# Patient Record
Sex: Female | Born: 1960 | Race: Black or African American | Hispanic: No | Marital: Single | Smoking: Former smoker
Health system: Southern US, Community
[De-identification: ages and names within clinical notes are randomized; demographics above are authoritative.]

## PROBLEM LIST (undated history)

## (undated) DIAGNOSIS — R5383 Other fatigue: Secondary | ICD-10-CM

## (undated) DIAGNOSIS — I10 Essential (primary) hypertension: Secondary | ICD-10-CM

## (undated) DIAGNOSIS — R7303 Prediabetes: Secondary | ICD-10-CM

## (undated) DIAGNOSIS — E119 Type 2 diabetes mellitus without complications: Secondary | ICD-10-CM

## (undated) DIAGNOSIS — D649 Anemia, unspecified: Secondary | ICD-10-CM

## (undated) DIAGNOSIS — J45909 Unspecified asthma, uncomplicated: Secondary | ICD-10-CM

## (undated) DIAGNOSIS — D219 Benign neoplasm of connective and other soft tissue, unspecified: Secondary | ICD-10-CM

## (undated) DIAGNOSIS — J309 Allergic rhinitis, unspecified: Secondary | ICD-10-CM

## (undated) DIAGNOSIS — N915 Oligomenorrhea, unspecified: Secondary | ICD-10-CM

## (undated) DIAGNOSIS — M7710 Lateral epicondylitis, unspecified elbow: Secondary | ICD-10-CM

## (undated) HISTORY — DX: Benign neoplasm of connective and other soft tissue, unspecified: D21.9

## (undated) HISTORY — DX: Oligomenorrhea, unspecified: N91.5

## (undated) HISTORY — DX: Anemia, unspecified: D64.9

## (undated) HISTORY — DX: Type 2 diabetes mellitus without complications: E11.9

## (undated) HISTORY — PX: OVARY SURGERY: SHX727

## (undated) HISTORY — DX: Unspecified asthma, uncomplicated: J45.909

## (undated) HISTORY — DX: Other fatigue: R53.83

## (undated) HISTORY — DX: Prediabetes: R73.03

## (undated) HISTORY — DX: Lateral epicondylitis, unspecified elbow: M77.10

## (undated) HISTORY — PX: FOOT SURGERY: SHX648

## (undated) HISTORY — DX: Allergic rhinitis, unspecified: J30.9

---

## 1998-02-20 ENCOUNTER — Other Ambulatory Visit: Admission: RE | Admit: 1998-02-20 | Discharge: 1998-02-20 | Payer: Self-pay | Admitting: Obstetrics and Gynecology

## 1999-01-29 ENCOUNTER — Other Ambulatory Visit: Admission: RE | Admit: 1999-01-29 | Discharge: 1999-01-29 | Payer: Self-pay | Admitting: Obstetrics and Gynecology

## 2000-11-24 ENCOUNTER — Other Ambulatory Visit: Admission: RE | Admit: 2000-11-24 | Discharge: 2000-11-24 | Payer: Self-pay | Admitting: Obstetrics and Gynecology

## 2001-12-15 ENCOUNTER — Emergency Department (HOSPITAL_COMMUNITY): Admission: EM | Admit: 2001-12-15 | Discharge: 2001-12-15 | Payer: Self-pay | Admitting: Emergency Medicine

## 2002-01-19 ENCOUNTER — Other Ambulatory Visit: Admission: RE | Admit: 2002-01-19 | Discharge: 2002-01-19 | Payer: Self-pay | Admitting: Obstetrics and Gynecology

## 2002-01-26 ENCOUNTER — Ambulatory Visit (HOSPITAL_COMMUNITY): Admission: RE | Admit: 2002-01-26 | Discharge: 2002-01-26 | Payer: Self-pay | Admitting: Obstetrics and Gynecology

## 2002-01-26 ENCOUNTER — Encounter: Payer: Self-pay | Admitting: Obstetrics and Gynecology

## 2002-02-01 ENCOUNTER — Encounter: Payer: Self-pay | Admitting: *Deleted

## 2002-02-01 ENCOUNTER — Encounter: Admission: RE | Admit: 2002-02-01 | Discharge: 2002-02-01 | Payer: Self-pay | Admitting: *Deleted

## 2003-02-01 ENCOUNTER — Other Ambulatory Visit: Admission: RE | Admit: 2003-02-01 | Discharge: 2003-02-01 | Payer: Self-pay | Admitting: Obstetrics and Gynecology

## 2003-05-23 ENCOUNTER — Emergency Department (HOSPITAL_COMMUNITY): Admission: EM | Admit: 2003-05-23 | Discharge: 2003-05-23 | Payer: Self-pay | Admitting: Emergency Medicine

## 2003-06-14 ENCOUNTER — Encounter: Admission: RE | Admit: 2003-06-14 | Discharge: 2003-07-27 | Payer: Self-pay | Admitting: General Practice

## 2004-03-14 ENCOUNTER — Other Ambulatory Visit: Admission: RE | Admit: 2004-03-14 | Discharge: 2004-03-14 | Payer: Self-pay | Admitting: Obstetrics and Gynecology

## 2004-03-16 ENCOUNTER — Ambulatory Visit (HOSPITAL_COMMUNITY): Admission: RE | Admit: 2004-03-16 | Discharge: 2004-03-16 | Payer: Self-pay | Admitting: Obstetrics and Gynecology

## 2004-05-22 ENCOUNTER — Ambulatory Visit (HOSPITAL_COMMUNITY): Admission: RE | Admit: 2004-05-22 | Discharge: 2004-05-22 | Payer: Self-pay | Admitting: Obstetrics and Gynecology

## 2004-07-03 ENCOUNTER — Ambulatory Visit: Admission: RE | Admit: 2004-07-03 | Discharge: 2004-07-03 | Payer: Self-pay | Admitting: Internal Medicine

## 2004-07-30 ENCOUNTER — Encounter: Admission: RE | Admit: 2004-07-30 | Discharge: 2004-07-30 | Payer: Self-pay | Admitting: Internal Medicine

## 2004-07-31 ENCOUNTER — Encounter: Admission: RE | Admit: 2004-07-31 | Discharge: 2004-08-24 | Payer: Self-pay | Admitting: Internal Medicine

## 2004-09-11 ENCOUNTER — Ambulatory Visit (HOSPITAL_COMMUNITY): Admission: RE | Admit: 2004-09-11 | Discharge: 2004-09-11 | Payer: Self-pay | Admitting: Nephrology

## 2004-12-10 ENCOUNTER — Encounter (INDEPENDENT_AMBULATORY_CARE_PROVIDER_SITE_OTHER): Payer: Self-pay | Admitting: *Deleted

## 2004-12-10 ENCOUNTER — Ambulatory Visit (HOSPITAL_COMMUNITY): Admission: RE | Admit: 2004-12-10 | Discharge: 2004-12-10 | Payer: Self-pay | Admitting: Obstetrics and Gynecology

## 2005-03-15 ENCOUNTER — Other Ambulatory Visit: Admission: RE | Admit: 2005-03-15 | Discharge: 2005-03-15 | Payer: Self-pay | Admitting: Obstetrics and Gynecology

## 2005-03-18 ENCOUNTER — Ambulatory Visit (HOSPITAL_COMMUNITY): Admission: RE | Admit: 2005-03-18 | Discharge: 2005-03-18 | Payer: Self-pay | Admitting: Obstetrics and Gynecology

## 2005-10-08 ENCOUNTER — Ambulatory Visit (HOSPITAL_COMMUNITY): Admission: RE | Admit: 2005-10-08 | Discharge: 2005-10-08 | Payer: Self-pay | Admitting: Nephrology

## 2005-12-30 ENCOUNTER — Encounter (INDEPENDENT_AMBULATORY_CARE_PROVIDER_SITE_OTHER): Payer: Self-pay | Admitting: *Deleted

## 2005-12-30 ENCOUNTER — Inpatient Hospital Stay (HOSPITAL_COMMUNITY): Admission: RE | Admit: 2005-12-30 | Discharge: 2006-01-01 | Payer: Self-pay | Admitting: Obstetrics and Gynecology

## 2006-03-18 ENCOUNTER — Other Ambulatory Visit: Admission: RE | Admit: 2006-03-18 | Discharge: 2006-03-18 | Payer: Self-pay | Admitting: Obstetrics and Gynecology

## 2006-03-19 ENCOUNTER — Ambulatory Visit (HOSPITAL_COMMUNITY): Admission: RE | Admit: 2006-03-19 | Discharge: 2006-03-19 | Payer: Self-pay | Admitting: Obstetrics and Gynecology

## 2006-11-19 ENCOUNTER — Ambulatory Visit: Payer: Self-pay | Admitting: Cardiovascular Disease

## 2006-12-04 ENCOUNTER — Ambulatory Visit: Payer: Self-pay

## 2006-12-04 ENCOUNTER — Encounter: Payer: Self-pay | Admitting: Cardiovascular Disease

## 2006-12-16 IMAGING — CR DG LUMBAR SPINE COMPLETE 4+V
5 series · 5 of 5 positions shown · non-contrast
Comparison: None.

CLINICAL DATA: Low back pain. 
 LUMBAR SPINE COMPLETE:

[view not recorded (1 of 5)]
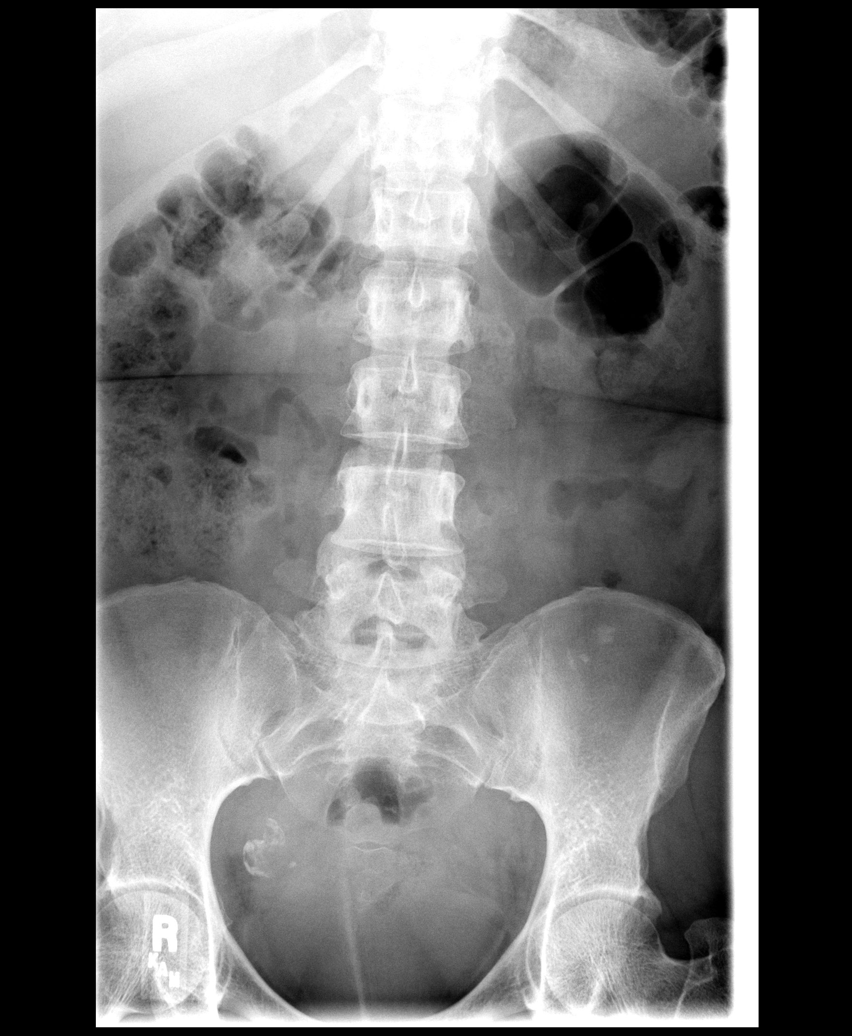

[view not recorded (2 of 5)]
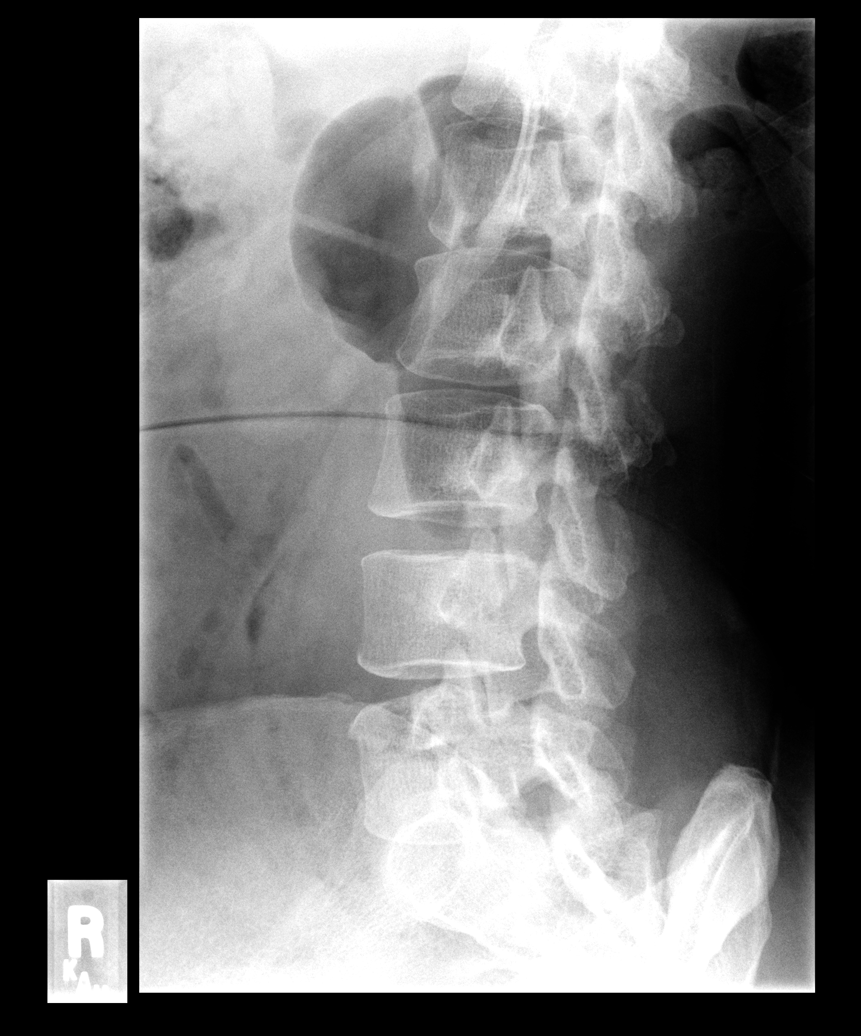

[view not recorded (3 of 5)]
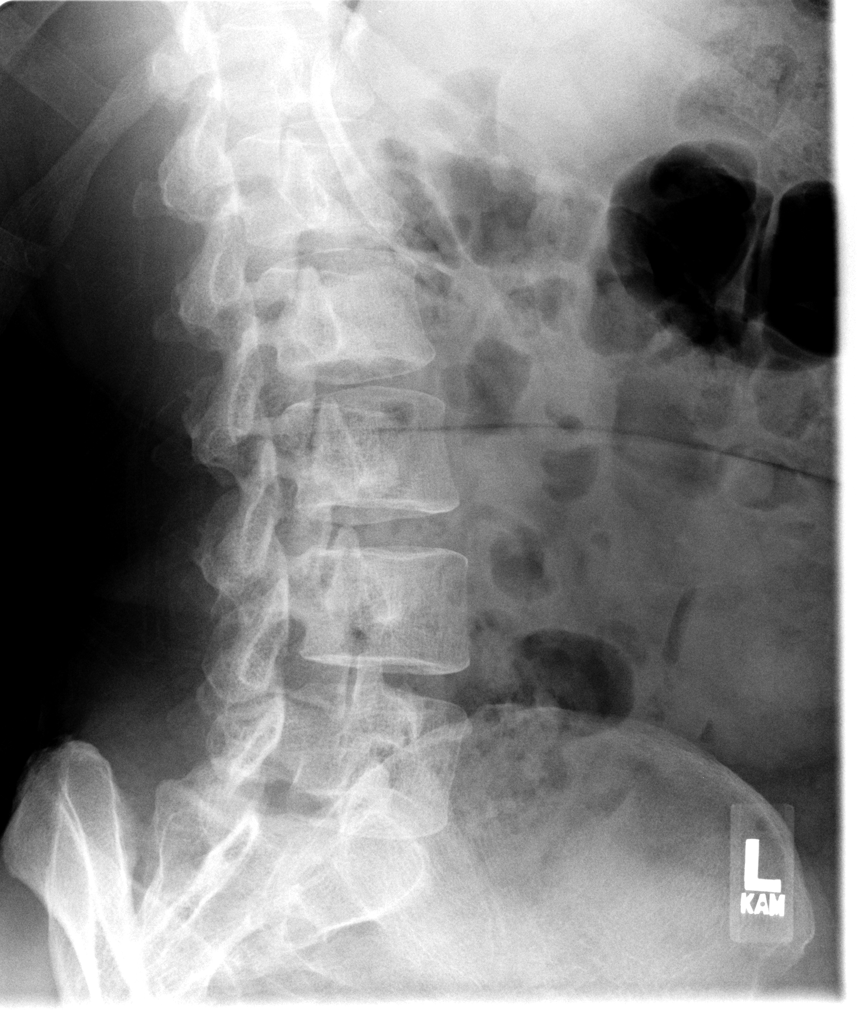

[view not recorded (4 of 5)]
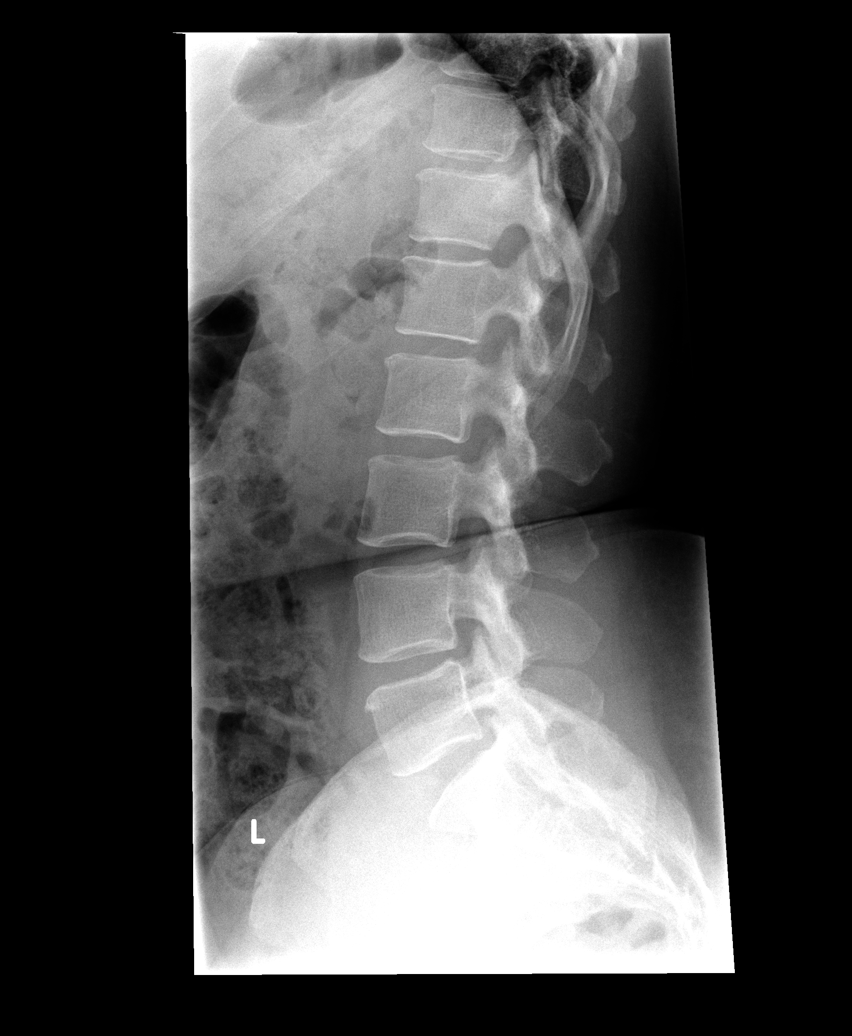

[view not recorded (5 of 5)]
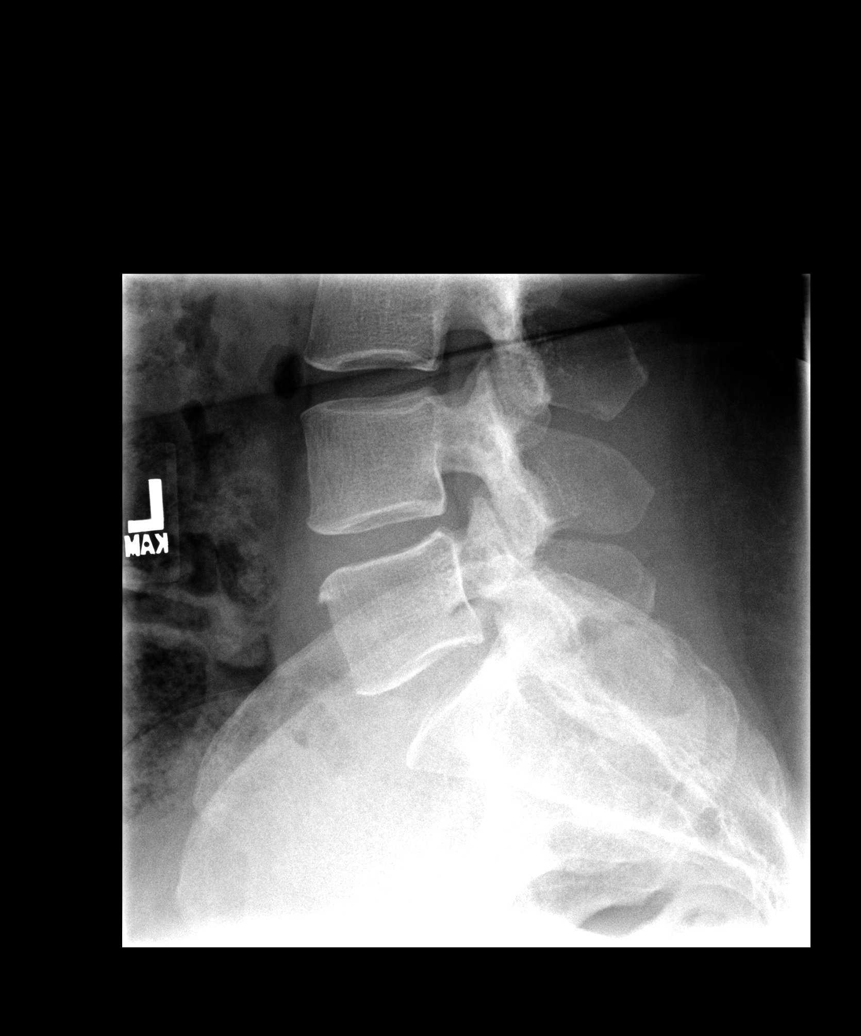

[5 of 5 positions shown; findings below may reference images not displayed]

FINDINGS: There are five non-rib bearing lumbar vertebrae.  Mild degenerative disc disease at L4-5.  Soft tissues unremarkable.  No acute changes. 
 Soft tissues show calcification in the right pelvis that is either in the uterus or ovary.  This may represent fibroid disease of the uterus, or it could represent calcification in a right ovarian dermoid tumor.  Consider ultrasound of the pelvis for further assessment if clinically warranted.
IMPRESSION: 1.  Mild degenerative disc disease at L4-5 ? no acute abnormality.  
 2.  Abnormal calcification in the right pelvis.  See report.

## 2007-04-08 ENCOUNTER — Ambulatory Visit (HOSPITAL_COMMUNITY): Admission: RE | Admit: 2007-04-08 | Discharge: 2007-04-08 | Payer: Self-pay | Admitting: Obstetrics and Gynecology

## 2008-04-08 ENCOUNTER — Ambulatory Visit (HOSPITAL_COMMUNITY): Admission: RE | Admit: 2008-04-08 | Discharge: 2008-04-08 | Payer: Self-pay | Admitting: Obstetrics and Gynecology

## 2010-04-20 ENCOUNTER — Ambulatory Visit (HOSPITAL_COMMUNITY): Admission: RE | Admit: 2010-04-20 | Discharge: 2010-04-20 | Payer: Self-pay | Admitting: Obstetrics and Gynecology

## 2010-08-11 ENCOUNTER — Encounter: Payer: Self-pay | Admitting: Internal Medicine

## 2010-12-07 NOTE — Op Note (Signed)
NAME:  Margaret Pena, Margaret Pena              ACCOUNT NO.:  [id number]   MEDICAL RECORD NO.:  [id number]          PATIENT TYPE:  AMB   LOCATION:  SDC                           [facility]   PHYSICIAN:  Naima A. Dillard, M.D. DATE OF BIRTH:  [birth year]   DATE OF PROCEDURE:  12/10/2004  DATE OF DISCHARGE:                                 OPERATIVE REPORT   PREOPERATIVE DIAGNOSES:  1.  Metrorrhagia.  2.  Uterine fibroids.  3.  Left ovarian endometrioma.  4.  Infertility.   POSTOPERATIVE DIAGNOSES:  1.  Metrorrhagia.  2.  Uterine fibroids.  3.  Left hydrosalpinx, normal-appearing left ovary.  4.  Normal-appearing right ovary and tube.  5.  Pelvic adhesions.  6.  Implants consistent with endometriosis.   PROCEDURES:  1.  Laparoscopy.  2.  Lysis of adhesions.  3.  Peritoneal biopsy.  4.  Dilatation and curettage.  5.  Hysteroscopy.  6.  Chromopertubation.   SURGEON:  Naima A. Normand Sloop, M.D.   ASSISTANT:  Dois Davenport A. Rivard, M.D.   ANESTHESIA:  General endotracheal tube.   SPECIMENS:  Posterior uterine biopsy, anterior cul-de-sac biopsies,  endometrial curettings.   ESTIMATED BLOOD LOSS:  Minimal.   COMPLICATIONS:  None.   Patient to PACU in stable condition.   PROCEDURE IN DETAIL:  The patient was taken to the operating room, where she  was given general anesthesia, prepped and draped in a normal sterile  fashion.  A bivalve speculum was placed into the vagina.  The anterior lip  of the cervix was grasped with a single-tooth tenaculum.  Lidocaine 1% 10 mL  was used for cervical block.  The uterus was then sounded to 10 cm.  The  cervix was further dilated with [facility] dilators.  The hysteroscope was placed  into the uterine cavity and there were no submucosal fibroids or polyps  seen.  Both the ostia were visualized.  The entire uterine cavity was  visualized.  A sharp curette was placed into the uterine cavity and  curettings were obtained until a gritty texture was noted.   There were  submucosal fibroids felt with a curette.  I looked again with the  hysteroscope, and the findings noted above were seen.  After the  hysteroscopy was finished, an acorn manipulator was placed in the cervix and  attached to the tenaculum.  Attention was then turned to the abdomen, where  a 10 mm infraumbilical incision was made after 5 mL 0.25% Marcaine with  epinephrine was infiltrated in the umbilicus.  This incision was taken down  to the fascia.  The fascia was incised in the midline and extended  bilaterally.  The peritoneum was identified, tented up and entered sharply.  An 0 Vicryl was placed in the fascia in a pursestring fashion.  The Hasson  was placed.  The abdomen was insufflated with CO2 gas.  Intra-abdominal  placement was confirmed with the laparoscope.  The findings are as follows:  Liver and abdominal anatomy appeared normal.  The uterus had several  fibroids, one large one actually about 5 cm of the fundus, one in  the  posterior aspect of the uterus and then several small ones about 3 cm, the  one in the posterior aspect of the uterus was about 3 cm.  The patient had a  left large hydrosalpinx with adhesions.  The left ovary appeared normal.  On  the anterior cul-de-sac, there were several chocolate-colored implants  noted.  The patient's right tube was plastered to the posterior side of the  uterus, right tube and ovary.  The right tube actually was normal.  There  was no hydrosalpinx, and fluid was noted to come out of that tube.  It was  delayed with chromopertubation, but it did come out.  Two 5 mm ports were  then placed, one suprapubic and one in the patient's right lower quadrant  under direct visualization of the laparoscope.  Hemostasis was assured.  The  patient's right tube and ovary was released from the uterus through lysis of  adhesions.  Hemostasis was made with cautery.  There was no hemostasis done  on the tube but just along the ovary and along  the posterior side of the  uterine wall.  Again, chromopertubation was done and that tube was seen to  spill.  On the left, you could see the tube fill with blue, but there was no  spill secondary to the hydrosalpinx.  There was a little bleb or posterior  chocolate cyst on the posterior aspect of the uterus, which was removed.  There was also a chocolate cyst seen anterior along the anterior cul-de-sac,  which was removed.  All other implants were cauterized.  The Nezhat was then  used for suction, hemostasis was noted.  Any bleeding areas were made  hemostatic with Bovie cautery.  Again the Nezhat irrigation was done of the  pelvis and hemostasis was noted.  At this time no further dissection was  felt that it could be done safely, and I will discuss the findings with the  patient and see how she wants to proceed.  All instruments were removed from  the abdomen under direct visualization of the laparoscope.  The fascia was  closed with tying the pursestring suture.  All skin incisions were closed  with 3-0 Vicryl and 3-0 Monocryl in subcuticular fashion.  The sponge, lap  and needle counts were correct x2.  The patient went to the recovery room in  stable condition.      NAD/MEDQ  D:  12/10/2004  T:  12/10/2004  Job:  [id number]

## 2010-12-07 NOTE — Assessment & Plan Note (Signed)
[facility]                            CARDIOLOGY OFFICE NOTE   Margaret Pena, Margaret Pena                     MRN:          [id number]  DATE:11/19/2006                            DOB:          Oct 12, 1960    [name] is a delightful 50 year old [place] woman who was referred  by Dr. Eliott Nine for lower extremity edema and some atypical chest pain.   The patient initially had been sent to Dr. Eliott Nine about 2 years ago for  significant hypertension; I do not know what workup was involved here,  but I am sure that renal artery stenosis was looked at.  The patient has  had some increasing lower extremity edema since November 2007.  She  apparently took a trip back to [place] and the weather was very hot and it  seemed to get worse there and has continued.  She has been placed on  diuretics by Dr. Eliott Nine.  She was concerned that this may reflect a  cardiac problem; in particular, the patient also has had some atypical  chest pain.  The chest pain is on both the left and right sides of the  chest.  It can be focused in the left apical region.  It is sharp in  nature.  It does not radiate anywhere.  It is not associated with  diaphoresis.  The patient has no history of deep venous problems.  She  has never had a PE.  Although she is sedentary, she has not been at  particularly high risk for DVT.   The patient's edema is dependent; she has had some relief with her  Lasix.  She does not particularly watch the salt in her diet.   There has been no previous history of venous vein stripping or venous  disease and no filariasis.   The patient's hypertension seems well-controlled lately.  Again, I do  not know what previous workup she has gotten.  The patient tells me that  they checked her kidney function and the protein in her urine and they  were fine.   REVIEW OF SYSTEMS:  The patient's review of systems is otherwise  unremarkable for no PND or orthopnea, no  palpitations, no syncope, no  previous cardiac workup.   MEDICATIONS:  1. Clonidine 0.3 mg a day.  2. Nifedipine 60 mg a day.  3. Lasix 40 mg a day.  4. Potassium 20 mEq a day.  5. Chantix, which she has been on for the last couple of months for      smoking cessation.  6. Fish oil.   ALLERGIES:  She has no known allergies.   PAST SURGICAL HISTORY:  1. She has had previous fallopian tube removed on the left in June      2007.  2. She has had spur repair of both feet in 2000 and 2001.   Her mother is alive at age 21.  Father died at age 51 of complications  of diabetes.   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  She is divorced.  She does not have  any children.  She has a sister in  this country, but her mother and brother are still in [place].   PHYSICAL EXAMINATION:  Healthy appearing black female in no distress  Her exam is remarkable for a blood pressure which is excellent at  130/70.  Her pulse is 65 and regular. RR 12 Afebrile  HEENT:  Normal.  There is no lymphadenopathy.  No JVP elevation.  No thyromegaly.  LUNGS:  Clear. No accesory muscle use  There is an S1 and S2 with normal heart sounds. PMI normal  ABDOMEN:  Benign.  There is no ascites.  There is no AAA.  There is no  hepatojugular reflux.  There are no masses.  Distal pulses are intact.  There is +1 edema bilaterally, slightly  greater on the right than the left.  There are no cords.  There is no  venous insufficiency, reflux or varicosities noted.  She has +3 distal  pulses bilaterally.  Neuro non-focal  No muscular weakness  No lymphadenopathy   EKG:  Normal without significant LVH or evidence of previous MI.   IMPRESSION:  The patient's lower extremity edema is unlikely to be  cardiac-related.  She has a normal exam and a normal EKG.  The patient  will continue to follow a low-salt diet and take her Lasix 40 mg a day.  She actually may need a slightly higher dose, given  the residual edema  that I see today.  I think it is reasonable to do a 2-D echocardiogram  as Dr. Eliott Nine suggested in regards to ruling out occult pulmonary  hypertension and assessing right ventricular and left ventricular  function as well as inferior vena cava caliper.   We will also do a venous duplex study to assess the patency of her  valves and to check her venous system bilaterally.   The patient's blood pressure seems very well-controlled.  From what I  gather, she has already had nephrotic syndrome ruled out, although it  does not sound like she had a 24-hour urine protein done.  I am also  assuming that Dr. Eliott Nine would have checked a TSH and T4.   I congratulated the patient on stopping her smoking.  She will continue  her Chantix.   Her chest pain is very atypical; it is nonexertional and at least for  the time-being, unless her echocardiogram is abnormal, I do not think  she needs a stress test at this time.   Further recommendations will be based on the results of her  echocardiogram and venous duplex.     [name]. Eden Emms, MD, [facility]  Electronically Signed    PCN/MedQ  DD: 11/19/2006  DT: 11/19/2006  Job #: [id number]   cc:   Duke Salvia. Eliott Nine, M.D.

## 2010-12-07 NOTE — H&P (Signed)
NAME:  Margaret Pena, Margaret Pena              ACCOUNT NO.:  [id number]   MEDICAL RECORD NO.:  [id number]          PATIENT TYPE:  AMB   LOCATION:  SDC                           [facility]   PHYSICIAN:  Naima A. Dillard, M.D. DATE OF BIRTH:  Dec 27, 1960   DATE OF ADMISSION:  DATE OF DISCHARGE:                                HISTORY & PHYSICAL   ADDENDUM  The patient also understands that there is a small risk that the endometrial  mass could be a cancer. She understands that she may have to have another  surgery. She was offered to go to a GYN oncologist for first consultation  and operation and the patient declined.      NAD/MEDQ  D:  12/10/2004  T:  12/10/2004  Job:  [id number]

## 2010-12-07 NOTE — Op Note (Signed)
NAME:  Margaret Pena, Margaret Pena              ACCOUNT NO.:  [id number]   MEDICAL RECORD NO.:  [id number]          PATIENT TYPE:  INP   LOCATION:  9320                          [facility]ACILITY:  [facility]   PHYSICIAN:  Naima A. Dillard, M.D. DATE OF BIRTH:  [birth year]   DATE OF PROCEDURE:  12/30/2005  DATE OF DISCHARGE:                                 OPERATIVE REPORT   PREOPERATIVE DIAGNOSES:  1.  Endometriosis.  2.  Left hydrosalpinx.  3.  Symptomatic uterine fibroids.  4.  Dysmenorrhea.   POSTOPERATIVE DIAGNOSES:  1.  Endometriosis.  2.  Left hydrosalpinx.  3.  Symptomatic uterine fibroids.  4.  Dysmenorrhea.   PROCEDURE:  Exploratory laparotomy, lysis of adhesions, removal of left  adnexal mass and uterine myomectomy.   SURGEON:  Naima A. Normand Sloop, M.D.   ASSISTANT:  Osborn Coho, M.D.   ANESTHESIA:  General.   SPECIMENS:  Left adnexal mass and myoma, sent to pathology.   ESTIMATED BLOOD LOSS:  Was 200 mL.   COMPLICATIONS:  None.  The patient to PACU in stable condition.   DESCRIPTION OF PROCEDURE:  The patient was taken to the operating room.  She  was placed in the dorsal supine position and prepped and draped in a normal  sterile fashion.  The Foley catheter was placed without difficulty.  A  Pfannenstiel skin incision was made with the scalpel and carried down to the  fascia with the Bovie cautery.  The fascia was incised in the midline and  extended bilaterally.  Kocher x2 were placed in the superior aspect of the  fascia and was dissected off the rectus muscle both sharply and bluntly.  The inferior aspect of the fascia was dissected in a similar fashion.  The  muscles were separated in the midline.  The __________ is identified and  tented up and entered sharply.  Then extended superiorly and inferiorly,  with good visualization of the bowel and bladder.  The bowel was packed away  with moist laparotomy sponges.  The Balfour retractor was placed without  difficulty.  An extender  was placed on the [place].  The retractor is made  to retract the bowel.  The retractor itself was over the sponges.  The  patient had multiple fibroids.  Approximately 20 units of  Pitressin in 50  mL normal saline was used over the fibroids, and through two incisions  multiple fibroids were removed with blunt and sharp dissection.  The uterine  incisions were repaired with #0 Vicryl and #3-0 Vicryl, without difficulty.  Hemostasis occurred.  The patient's right tube and ovary were normal.  The  patient's left fallopian tube could not be defined or see it.  You could  also not see her left ovary as there was a large adhesion on the left.  So  the round ligament was grasped, ligated and cut and the peritoneum going  along the left infundibular pelvic ligament was dissected away.  The ureter  was first visualized and noted to be free.  What appeared to be the  fallopian tube and the endometrium were grasped with a Tanja Port and  gently  dissected both sharply and bluntly.  Later we did see endometrium and the  left ovary was identified.  The left adnexal mass endometrium was removed, I  believe it was her tube, without difficulty.  It was clamped, cut and suture  ligated.  Hemostasis was assured.  Irrigation was done and any bleeding  areas were made hemostatic with Bovie cautery or suture.  The myomectomy  sites were all noted to be hemostatic.  There was some Gelfoam  placed into  the pelvic side wall where some dissection had taken place.  Also Gelfoam  was placed along the suture line of the fundus where the myoma was removed.  The myoma was removed from the fundus and two anteriorly and four  posteriorly.  Gelfoam was placed along the suture line.  Interceed was  placed over the entire uterus.  All instruments were then removed from the  abdomen.  The peritoneum was closed with #0 chromic without difficulty.  The  muscle was noted to be hemostatic.  There was a little hematoma on the left   side of the rectus, which was made hemostatic with a figure-of-eight #0  Vicryl and some Bovie cautery.  The rest of the rectus muscles remained  hemostatic.  The fascia was closed with #0 Vicryl in a running fashion from  right and left median to center.  The subcutaneous tissues were irrigated  and made hemostatic with Bovie cautery.  The subcutaneous tissues were  reapproximated with #2-0 plain.  The skin was closed with #3-0 Monocryl.      Naima A. Normand Sloop, M.D.  Electronically Signed     NAD/MEDQ  D:  12/30/2005  T:  12/30/2005  Job:  [id number]

## 2010-12-07 NOTE — Discharge Summary (Signed)
NAME:  Margaret Pena, Margaret Pena              ACCOUNT NO.:  [id number]   MEDICAL RECORD NO.:  [id number]          PATIENT TYPE:  INP   LOCATION:  9320                          [facility]ACILITY:  [facility]   PHYSICIAN:  Naima A. Dillard, M.D. DATE OF BIRTH:  25-May-1961   DATE OF ADMISSION:  12/30/2005  DATE OF DISCHARGE:  01/01/2006                                 DISCHARGE SUMMARY   ADMISSION DIAGNOSIS:  Endometriosis, left hydrosalpinx, symptomatic uterine  fibroid and dysmenorrhea.   PROCEDURE PERFORMED:  Exploratory laparotomy and lysis of adhesions, removal  of left adnexal mass and uterine myomectomy.   DISCHARGE MEDICATIONS:  Motrin, Phenergan, Colace and Percocet.   HOSPITAL COURSE:  The patient underwent an exploratory laparotomy and lysis  of adhesions, removal of left adnexal mass, uterine myomectomy.  On  postoperative day 0 the patient was afebrile with good vital signs and given  routine care.  On postoperative day #1 the patient had not yet had bowel  movement or flatus.  Her t-max was 99.3.  Incision was clean, dry and  intact.  Her preop hemoglobin was 13.4, postoperative hemoglobin was 11.3.  Platelets 345.  Her BUN and creatinine were 2 and 0.7.  The patient's Foley  catheter was discontinued.  The patient was ordered to ambulate.  On  postoperative day #2, she was tolerating p.o. and had flatus.  She was  afebrile with good vital signs.  Her abdomen was soft, good bowel sounds.  The incision was clean, dry and intact.  The patient was deemed to benefit  from her hospital stay and was discharged home.  She is to follow up with me  in six weeks and because her blood sugars were a little high, she is to  follow up with a primary care doctor for testing for diabetes.  The patient  was discharged in stable condition.      Naima A. Normand Sloop, M.D.  Electronically Signed     NAD/MEDQ  D:  02/19/2006  T:  02/19/2006  Job:  [id number]

## 2010-12-07 NOTE — H&P (Signed)
NAME:  Margaret Pena, Margaret Pena              ACCOUNT NO.:  [id number]   MEDICAL RECORD NO.:  [id number]          PATIENT TYPE:  AMB   LOCATION:  SDC                           [facility]   PHYSICIAN:  Naima A. Dillard, M.D. DATE OF BIRTH:  Apr 12, 1961   DATE OF ADMISSION:  12/29/2005  DATE OF DISCHARGE:                                HISTORY & PHYSICAL   CHIEF COMPLAINT:  Endometriosis, symptomatic fibroids and dysmenorrhea.   The patient underwent a laparoscopy in 2006 for evaluation of infertility  and was felt to have a left hydrosalpinx, some fibroids and endometriosis.  As much of the endometriosis that could be taken off or removed was but the  patient now desires to remove the left hydrosalpinx and the fibroids.  The  patient's medications include clonidine, hydrochlorothiazide, nifedipine,  Allegra, potassium.   PAST MEDICAL HISTORY:  Significant for chronic hypertension.   PAST SURGICAL HISTORY:  Significant for diagnostic laparoscopy, D&C  hysteroscopy, bilateral foot surgery.   ALLERGIES:  The patient has no known drug allergies.   SOCIAL HISTORY:  Significant for tobacco about two to four cigarettes a day.  No illicit drug use and no alcohol.   FAMILY HISTORY:  Unremarkable.   REVIEW OF SYSTEMS:  GENITOURINARY:  As above.  ENDOCRINE/CARDIAC/MUSCULOSKELETAL/PSYCHIATRIC:  All unremarkable.   PHYSICAL EXAMINATION:  Blood pressure is 120/80, weight is 191.  Pupils are  equal.  Hearing is normal.  Throat is clear.  Thyroid is not enlarged.  Heart has regular rate and rhythm.  Chest is clear to auscultation  bilaterally.  Breasts have no masses, discharge, skin changes or nipple  retractions bilaterally.  Abdomen is nondistended without any masses or  organomegaly.  Extremities has no clubbing, cyanosis, or edema.  Neurologic  exam is within normal limits.  Full vaginal exam is within normal limits.  Cervix is nontender without any lesions.  Uterus is about 9 weeks size,  mobile, nontender adnexa.  No palpable masses.   On ultrasound, the patient has a left adnexal hydrosalpinx and several  fibroids, the largest measuring 5 cm at the fundus.   ASSESSMENT:  1.  Endometriosis.  2.  Leiomyomata.  3.  Dysmenorrhea.   PLAN:  1.  Exploratory laparotomy.  2.  Myomectomy.  3.  Lysis of adhesions.  4.  Possible left salpingectomy.   The patient wishes to keep both ovaries and keep her right tube if possible.  The patient understands the risks are, but not limited to bleeding,  infection, damage to internal organs such as bowel, bladder, major vessels,  bleeding and hysterectomy.      Naima A. Normand Sloop, M.D.  Electronically Signed     NAD/MEDQ  D:  12/29/2005  T:  12/30/2005  Job:  [id number]

## 2010-12-07 NOTE — H&P (Signed)
NAME:  Margaret Pena, Margaret Pena              ACCOUNT NO.:  [id number]   MEDICAL RECORD NO.:  [id number]          PATIENT TYPE:  AMB   LOCATION:  SDC                           FACILITY:  [facility]   PHYSICIAN:  Naima A. Dillard, M.D. DATE OF BIRTH:  11-09-1960   DATE OF ADMISSION:  DATE OF DISCHARGE:                                HISTORY & PHYSICAL   CHIEF COMPLAINT:  Metrorrhagia, complex ovarian cyst consistent with  endometrioma, and infertility.   HISTORY AND PHYSICAL:  The patient is a 50 year old African-American female  gravida 1 para 0-0-1-0 who presented with irregular vaginal bleeding,  infertility, and a complex ovarian cyst consistent with an endometrioma that  did not resolve on ultrasound. The patient has had a history of fibroids and  on ultrasound uterus is 11 x 6.1 x 7.3 cm with multiple fibroids, the  largest being about 3 cm. Also, a questionable hyperechoic mass is seen in  the endometrium measuring about 0.5 x 0.6 cm. There is also a 3.8 cm complex  left ovarian mass consistent with endometrioma that has been present since  August 2005. A filling defect was also see on hysterosalpingogram at the  fundus and hysterosalpingogram was also significant for bilateral  hydrosalpinges. The patient was sent to consultation with Dr. Elesa Hacker who  told her that her only option would be in vitro fertilization and there was  only a 1-2% chance of conceiving. Because of the low chances and the risk of  AMA, the patient decided not to proceed with IVF or adoption at this point.   PAST MEDICAL HISTORY:  Significant for chronic hypertension. The patient has  no known drug allergies. Medications include atenolol and  hydrochlorothiazide.   PAST SURGICAL HISTORY:  Significant for bilateral foot surgery.   FAMILY HISTORY:  Significant for mother with chronic hypertension and  diabetes. No ovarian or breast cancer.   SOCIAL HISTORY:  Significant for five cigarettes a day and denies any  alcohol  or illicit drug use.   REVIEW OF SYSTEMS:  GENITOURINARY:  As above. CARDIOVASCULAR:  Significant  for chronic hypertension. PSYCHIATRIC, MUSCULOSKELETAL, ENDOCRINE, GI:  All  unremarkable.   PHYSICAL EXAMINATION:  VITAL SIGNS:  The patient's blood pressure is 102/68,  her weight is 189 pounds.  HEENT:  Her pupils are equal, round, reactive to light and accommodation and  thyroid is not enlarged.  HEART:  Regular rate and rhythm.  LUNGS:  Clear to auscultation bilaterally.  BREASTS:  Have no masses, discharge, skin changes, or nipple retraction  bilaterally.  BACK:  Has no CVA tenderness bilaterally.  ABDOMEN:  Soft and nontender.  PELVIC:  Vulvovaginal exam is within normal limits. Cervix is nontender  without any lesions. Uterus is about 10 weeks size and irregular with no  adnexal masses palpated. Rectovaginal exam was deferred.   PERTINENT LABORATORY DATA:  Her Pap, GC, and chlamydia in August 2005 all  were normal.   ASSESSMENT:  1.  Ovarian cyst consistent with endometrioma.  2.  Endometrial mass, metrorrhagia, and fibroids.  3.  Infertility.   PLAN:  Laparoscopic ovarian cystectomy, possibly oophorectomy, D&C  hysteroscopy, removal of endometrial mass, a resection of endometrial mass,  and chromopertubation. The patient understands the risks are but not limited  to bleeding; infection; damage to internal organs such as bowel, bladder,  major blood vessels, and uterus. The patient was given the option to do this  with Dr. Elesa Hacker or reproductive endocrinologist and declined. She was also  offered a third opinion with a referral to endocrinologist but declined. The  patient understands the risks of the surgery and desires to proceed.      NAD/MEDQ  D:  12/10/2004  T:  12/10/2004  Job:  [id number]

## 2011-07-29 ENCOUNTER — Emergency Department (HOSPITAL_COMMUNITY)
Admission: EM | Admit: 2011-07-29 | Discharge: 2011-07-29 | Disposition: A | Discharge status: Home / Self Care | Payer: Self-pay | Source: Home / Self Care | Attending: Family Medicine | Admitting: Family Medicine

## 2011-07-29 ENCOUNTER — Encounter: Payer: Self-pay | Admitting: *Deleted

## 2011-07-29 DIAGNOSIS — R6889 Other general symptoms and signs: Secondary | ICD-10-CM

## 2011-07-29 HISTORY — DX: Essential (primary) hypertension: I10

## 2011-07-29 MED ORDER — OSELTAMIVIR PHOSPHATE 75 MG PO CAPS: 75.0000 mg | ORAL_CAPSULE | Freq: Two times a day (BID) | ORAL | Status: AC

## 2011-07-29 MED ORDER — IPRATROPIUM BROMIDE 0.06 % NA SOLN: 2.0000 | Freq: Four times a day (QID) | NASAL | Status: AC

## 2011-07-29 NOTE — ED Notes (Signed)
Pt  Has  Symptoms  Of  Body  Aches   Cough  /  Congested       With  Associated  Chills  Symptoms  X   1  Days   Child  In  Private  Room  With  Door  And  Masked     She  Is  In  No  Acute  Distress  And  She  Is  Speaking in complete  sentances

## 2011-07-29 NOTE — ED Provider Notes (Addendum)
History     CSN: [id number]  Arrival date & time 07/29/11  1722   First MD Initiated Contact with Patient 07/29/11 1853      Chief Complaint  Patient presents with  . Cough    (Consider location/radiation/quality/duration/timing/severity/associated sxs/prior treatment) Patient is a 51 y.o. female presenting with cough. The history is provided by the patient.  Cough This is a new problem. The current episode started yesterday. The problem occurs constantly. The problem has been gradually worsening. The cough is non-productive. The maximum temperature recorded prior to her arrival was 100 to 100.9 F. Associated symptoms include chills, rhinorrhea, sore throat and myalgias. She is not a smoker.    Past Medical History  Diagnosis Date  . Hypertension     Past Surgical History  Procedure Date  . Ovary surgery   . Foot surgery     Family History  Problem Relation Age of Onset  . Hypertension Mother   . Diabetes Father   . Hypertension Father     History  Substance Use Topics  . Smoking status: Current Everyday Smoker  . Smokeless tobacco: Not on file  . Alcohol Use: Yes     social    OB History    Grav Para Term Preterm Abortions TAB SAB Ect Mult Living                  Review of Systems  Constitutional: Positive for fever and chills.  HENT: Positive for congestion, sore throat, rhinorrhea and postnasal drip.   Respiratory: Positive for cough.   Gastrointestinal: Negative.   Musculoskeletal: Positive for myalgias.    Allergies  Review of patient's allergies indicates no known allergies.  Home Medications   Current Outpatient Rx  Name Route Sig Dispense Refill  . BIMATOPROST 0.01 % OP SOLN  1 drop at bedtime.      Marya Landry PO Oral Take by mouth.      . IPRATROPIUM BROMIDE 0.06 % NA SOLN Nasal Place 2 sprays into the nose 4 (four) times daily. 15 mL 12  . OSELTAMIVIR PHOSPHATE 75 MG PO CAPS Oral Take 1 capsule (75 mg total) by mouth every 12 (twelve)  hours. 10 capsule 0    BP 139/82  Pulse 70  Temp(Src) 99.2 F (37.3 C) (Oral)  Resp 22  SpO2 96%  Physical Exam  Nursing note and vitals reviewed. Constitutional: She appears well-developed and well-nourished.  HENT:  Head: Normocephalic.  Right Ear: External ear normal.  Left Ear: External ear normal.  Mouth/Throat: Oropharynx is clear and moist.  Eyes: Conjunctivae are normal. Pupils are equal, round, and reactive to light.  Neck: Normal range of motion. Neck supple.  Cardiovascular: Normal rate, normal heart sounds and intact distal pulses.   Pulmonary/Chest: Effort normal and breath sounds normal.  Lymphadenopathy:    She has cervical adenopathy.  Skin: Skin is warm and dry.    ED Course  Procedures (including critical care time)  Labs Reviewed - No data to display No results found.   1. Influenza-like illness       MDM          Barkley Bruns, MD 07/29/11 6295  Barkley Bruns, MD 07/29/11 [phone]

## 2011-07-29 NOTE — Discharge Instructions (Signed)
Drink plenty of fluids as discussed, use medicine as prescribed, and mucinex or delsym for cough. Return or see your doctor if further problems °

## 2011-12-09 ENCOUNTER — Ambulatory Visit
Admission: RE | Admit: 2011-12-09 | Discharge: 2011-12-09 | Disposition: A | Discharge status: Home / Self Care | Payer: BC Managed Care – PPO | Source: Ambulatory Visit | Attending: General Practice | Admitting: General Practice

## 2011-12-09 ENCOUNTER — Other Ambulatory Visit: Payer: Self-pay | Admitting: General Practice

## 2011-12-09 DIAGNOSIS — M545 Low back pain: Secondary | ICD-10-CM

## 2011-12-27 ENCOUNTER — Telehealth: Payer: Self-pay | Admitting: Obstetrics and Gynecology

## 2011-12-27 NOTE — Telephone Encounter (Signed)
Tc to pt per telephone call. Told pt CBC,TSH,Vit D-all wnl. Pt voices understanding. Pt req copy of labs to be mailed to her. Labs mailed to pt today. Pt agrees.

## 2011-12-27 NOTE — Telephone Encounter (Signed)
Chandra/lab res/elect

## 2012-05-27 ENCOUNTER — Other Ambulatory Visit: Payer: Self-pay | Admitting: Obstetrics and Gynecology

## 2012-05-27 DIAGNOSIS — Z1231 Encounter for screening mammogram for malignant neoplasm of breast: Secondary | ICD-10-CM

## 2012-06-15 ENCOUNTER — Ambulatory Visit (HOSPITAL_COMMUNITY)
Admission: RE | Admit: 2012-06-15 | Discharge: 2012-06-15 | Disposition: A | Discharge status: Home / Self Care | Payer: BC Managed Care – PPO | Source: Ambulatory Visit | Attending: Obstetrics and Gynecology | Admitting: Obstetrics and Gynecology

## 2012-06-15 DIAGNOSIS — Z1231 Encounter for screening mammogram for malignant neoplasm of breast: Secondary | ICD-10-CM | POA: Insufficient documentation

## 2012-07-08 ENCOUNTER — Ambulatory Visit (INDEPENDENT_AMBULATORY_CARE_PROVIDER_SITE_OTHER): Payer: BC Managed Care – PPO | Admitting: Obstetrics and Gynecology

## 2012-07-08 ENCOUNTER — Encounter: Payer: Self-pay | Admitting: Obstetrics and Gynecology

## 2012-07-08 VITALS — BP 122/90 | Temp 99.1°F | Ht 62.75 in | Wt 219.0 lb

## 2012-07-08 DIAGNOSIS — R5383 Other fatigue: Secondary | ICD-10-CM | POA: Insufficient documentation

## 2012-07-08 DIAGNOSIS — N915 Oligomenorrhea, unspecified: Secondary | ICD-10-CM

## 2012-07-08 DIAGNOSIS — D219 Benign neoplasm of connective and other soft tissue, unspecified: Secondary | ICD-10-CM

## 2012-07-08 DIAGNOSIS — R351 Nocturia: Secondary | ICD-10-CM

## 2012-07-08 DIAGNOSIS — Z124 Encounter for screening for malignant neoplasm of cervix: Secondary | ICD-10-CM

## 2012-07-08 DIAGNOSIS — D259 Leiomyoma of uterus, unspecified: Secondary | ICD-10-CM

## 2012-07-08 DIAGNOSIS — Z01419 Encounter for gynecological examination (general) (routine) without abnormal findings: Secondary | ICD-10-CM

## 2012-07-08 DIAGNOSIS — D649 Anemia, unspecified: Secondary | ICD-10-CM

## 2012-07-08 DIAGNOSIS — I10 Essential (primary) hypertension: Secondary | ICD-10-CM

## 2012-07-08 DIAGNOSIS — R7303 Prediabetes: Secondary | ICD-10-CM | POA: Insufficient documentation

## 2012-07-08 LAB — POCT URINALYSIS DIPSTICK
Bilirubin, UA: NEGATIVE
Blood, UA: NEGATIVE
Glucose, UA: NEGATIVE
Ketones, UA: NEGATIVE
Nitrite, UA: NEGATIVE
Spec Grav, UA: 1.01
Urobilinogen, UA: NEGATIVE
pH, UA: 6

## 2012-07-08 NOTE — Progress Notes (Signed)
Subjective:    Margaret Pena is a 51 y.o. female . who presents for annual exam.  The patient has no complaints today.  Last Pap: 04/19/2009 WNL: Yes Regular Periods:no Contraception: none  Monthly Breast exam:no Tetanus<32yrs:no Nl.Bladder Function:no nocturia Daily BMs:yes Healthy Diet:yes Calcium:no Mammogram:yes Date of Mammogram: 06/16/2012 Exercise:yes Have often Exercise: occ Seatbelt: yes Abuse at home: no Stressful work:no Sigmoid-colonoscopy: not yet Bone Density: No PCP: Dr. Julio Sicks Change in PMH:dx of pre-diabetes(on meds), asthma., calcified right uterine fibroid found on lumbar scan 06/16/12 Change in FMH:no change BMI-39   Irreg Periods: yes Mood Swings: ? Hot Flashes: yes Vaginal Dryness: no Poor Sleeping: yes Urinary Urgency: no UTI Symptoms: nocturia 3-4x nightly HRT: no Fam Hx Osteo: no Prior Bone Scan: No Osteoporosis: No Hx of Dvt: No   The following portions of the patient's history were reviewed and updated as appropriate: allergies, current medications, past family history, past medical history, past social history, past surgical history and problem list.  Review of Systems Pertinent items are noted in HPI. Gastrointestinal:No change in bowel habits, no abdominal pain, no rectal bleeding Genitourinary:negative for dysuria, frequency, hematuria, nocturia and urinary incontinence    Objective:     There were no vitals taken for this visit.  Weight:  Wt Readings from Last 1 Encounters:  No data found for Wt     BMI: There is no height or weight on file to calculate BMI. General Appearance: Alert, appropriate appearance for age. No acute distress HEENT: Grossly normal Neck / Thyroid: Supple, no masses, nodes or enlargement Lungs: clear to auscultation bilaterally Back: No CVA tenderness Breast Exam: No masses or nodes.No dimpling, nipple retraction or discharge. Cardiovascular: Regular rate and rhythm. S1, S2, no  murmur Gastrointestinal: Soft, non-tender, no masses or organomegaly Pelvic Exam: External genitalia: normal general appearance Vaginal: atrophic mucosa Cervix: normal appearance Adnexa: non palpable Uterus: retroverted and irregular enlargement Exam limited by body habitus Rectovaginal: normal rectal, no masses Lymphatic Exam: Non-palpable nodes in neck, clavicular, axillary, or inguinal regions Skin: no rash or abnormalities Neurologic: Normal gait and speech, no tremor  Psychiatric: Alert and oriented, appropriate affect.    Urinalysis:Not done    Assessment:    Asymptomatic uterine fibroids  Asymptomatic menopause   Plan:   mammogram pap smear with I risk HPV return annually or prn Congratulated on 1 year of smoking cessation    Dierdre Forth MD

## 2012-07-10 LAB — PAP IG AND HPV HIGH-RISK

## 2013-07-30 ENCOUNTER — Encounter: Payer: Self-pay | Admitting: Neurology

## 2013-08-02 ENCOUNTER — Encounter: Payer: Self-pay | Admitting: Neurology

## 2013-08-02 ENCOUNTER — Ambulatory Visit (INDEPENDENT_AMBULATORY_CARE_PROVIDER_SITE_OTHER): Payer: BC Managed Care – PPO | Admitting: Neurology

## 2013-08-02 ENCOUNTER — Encounter (INDEPENDENT_AMBULATORY_CARE_PROVIDER_SITE_OTHER): Payer: Self-pay

## 2013-08-02 VITALS — BP 138/84 | HR 81 | Ht 64.0 in | Wt 219.0 lb

## 2013-08-02 DIAGNOSIS — M7711 Lateral epicondylitis, right elbow: Secondary | ICD-10-CM

## 2013-08-02 DIAGNOSIS — M7710 Lateral epicondylitis, unspecified elbow: Secondary | ICD-10-CM

## 2013-08-02 HISTORY — DX: Lateral epicondylitis, unspecified elbow: M77.10

## 2013-08-02 MED ORDER — DICLOFENAC SODIUM 75 MG PO TBEC: 75.0000 mg | DELAYED_RELEASE_TABLET | Freq: Two times a day (BID) | ORAL | Status: AC

## 2013-08-02 NOTE — Progress Notes (Signed)
Reason for visit: Right arm pain  Margaret Pena is a 53 y.o. female  History of present illness:  Margaret Pena is a 53 year old right-handed black female with a history of right elbow pain dating back 7-8 months. The patient indicates that she will have more discomfort when she is lifting something with the arm, and she feels better when she is not using her arm. The patient denies any shoulder pain per se, and she denies any neck discomfort. The patient is not having any numbness in the arm, and she denies any problems with the left arm or with the legs. The patient denies any balance issues or problems controlling the bowels or the bladder. The patient is sent to this office for an evaluation of the right arm pain. If anything, the pain has gradually improved some over time.  Past Medical History  Diagnosis Date  . Hypertension   . Fibroids     calcified right uterus  . Anemia   . Prediabetes   . Fatigue   . Oligomenorrhea   . Diabetes mellitus without complication   . Asthma   . Allergic rhinitis   . Epicondylitis, lateral (tennis elbow) 08/02/2013    Right    Past Surgical History  Procedure Laterality Date  . Ovary surgery      left  . Foot surgery      Family History  Problem Relation Age of Onset  . Hypertension Mother   . Diabetes Mother   . Diabetes Father   . Hypertension Father   . Asthma Brother   . Asthma Maternal Aunt     Social history:  reports that she quit smoking about 2 years ago. She has never used smokeless tobacco. She reports that she drinks alcohol. She reports that she does not use illicit drugs.  Medications:  Current Outpatient Prescriptions on File Prior to Visit  Medication Sig Dispense Refill  . albuterol (PROVENTIL HFA;VENTOLIN HFA) 108 (90 BASE) MCG/ACT inhaler Inhale 2 puffs into the lungs every 6 (six) hours as needed.      Marland Kitchen amLODipine (NORVASC) 5 MG tablet Take 5 mg by mouth daily.      . bimatoprost (LUMIGAN) 0.01 % SOLN 1  drop at bedtime.        . Calcium Carbonate-Vitamin D (CALCIUM + D PO) Take 1 tablet by mouth daily.      . Multiple Vitamins-Minerals (MULTIVITAL PO) Take 1 tablet by mouth daily.      . sitaGLIPtan-metformin (JANUMET) 50-1000 MG per tablet Take 1 tablet by mouth 2 (two) times daily with a meal.      . valsartan-hydrochlorothiazide (DIOVAN-HCT) 320-25 MG per tablet Take 1 tablet by mouth daily.      . vitamin B-12 (CYANOCOBALAMIN) 100 MCG tablet Take 100 mcg by mouth daily.      Marland Kitchen ipratropium (ATROVENT) 0.06 % nasal spray Place 2 sprays into the nose 4 (four) times daily.  15 mL  12   No current facility-administered medications on file prior to visit.      Allergies  Allergen Reactions  . Pollen Extract     ROS:  Out of a complete 14 system review of symptoms, the patient complains only of the following symptoms, and all other reviewed systems are negative.  Shortness of breath, wheezing, snoring Arm pain  Blood pressure 138/84, pulse 81, height 5\' 4"  (1.626 m), weight 219 lb (99.338 kg).  Physical Exam  General: The patient is alert and cooperative at  the time of the examination. The patient is moderately obese.  Eyes: Pupils are equal, round, and reactive to light. Discs are flat bilaterally.  Neck: The neck is supple, no carotid bruits are noted.  Respiratory: The respiratory examination is clear.  Cardiovascular: The cardiovascular examination reveals a regular rate and rhythm, no obvious murmurs or rubs are noted.  Neuromuscular: Range of movement of the cervical spine is full and normal.  Skin: Extremities are without significant edema.  Neurologic Exam  Mental status: The patient is alert and oriented x 3 at the time of the examination. The patient has apparent normal recent and remote memory, with an apparently normal attention span and concentration ability.  Cranial nerves: Facial symmetry is present. There is good sensation of the face to pinprick and soft  touch bilaterally. The strength of the facial muscles and the muscles to head turning and shoulder shrug are normal bilaterally. Speech is well enunciated, no aphasia or dysarthria is noted. Extraocular movements are full. Visual fields are full. The tongue is midline, and the patient has symmetric elevation of the soft palate. No obvious hearing deficits are noted.  Motor: The motor testing reveals 5 over 5 strength of all 4 extremities. Good symmetric motor tone is noted throughout. With the right arm extended, extension of the wrist induces pain with resistance.  Sensory: Sensory testing is intact to pinprick, soft touch, vibration sensation, and position sense on all 4 extremities. No evidence of extinction is noted.  Coordination: Cerebellar testing reveals good finger-nose-finger and heel-to-shin bilaterally.  Gait and station: Gait is normal. Tandem gait is normal. Romberg is negative. No drift is seen.  Reflexes: Deep tendon reflexes are symmetric and normal bilaterally. Toes are downgoing bilaterally.   Assessment/Plan:  1. Right arm pain, probable low-grade lateral epicondylitis  The patient will be given a trial on diclofenac over the next 6-8 weeks. If she is not improving, she is to call our office, and we will set up a referral to an orthopedic surgeon for further treatment. The patient is to be careful about lifting objects with the right arm.  [name] 08/02/2013 7:46 PM  [facility] [address] [place], [place] [address]  Phone [phone] Fax [phone]

## 2013-08-02 NOTE — Patient Instructions (Signed)
Tennis Elbow  Your caregiver has diagnosed you with a condition often referred to as "tennis elbow." This results from small tears or soreness (inflammation) at the start (origin) of the extensor muscles of the forearm. Although the condition is often called tennis or golfer's elbow, it is caused by any repetitive action performed by your elbow.  HOME CARE INSTRUCTIONS   If the condition has been short lived, rest may be the only treatment required. Using your opposite hand or arm to perform the task may help. Even changing your grip may help rest the extremity. These may even prevent the condition from recurring.   Longer standing problems, however, will often be relieved faster by:   Using anti-inflammatory agents.   Applying ice packs for 30 minutes at the end of the working day, at bed time, or when activities are finished.   Your caregiver may also have you wear a splint or sling. This will allow the inflamed tendon to heal.  At times, steroid injections aided with a local anesthetic will be required along with splinting for 1 to 2 weeks. Two to three steroid injections will often solve the problem. In some long standing cases, the inflamed tendon does not respond to conservative (non-surgical) therapy. Then surgery may be required to repair it.  MAKE SURE YOU:    Understand these instructions.   Will watch your condition.   Will get help right away if you are not doing well or get worse.  Document Released: 07/08/2005 Document Revised: 09/30/2011 Document Reviewed: 02/24/2008  [employer] Patient Information 2014 [employer].

## 2013-08-30 ENCOUNTER — Other Ambulatory Visit (INDEPENDENT_AMBULATORY_CARE_PROVIDER_SITE_OTHER): Payer: Self-pay | Admitting: Otolaryngology

## 2013-08-30 DIAGNOSIS — J329 Chronic sinusitis, unspecified: Secondary | ICD-10-CM

## 2013-09-03 ENCOUNTER — Ambulatory Visit
Admission: RE | Admit: 2013-09-03 | Discharge: 2013-09-03 | Disposition: A | Discharge status: Home / Self Care | Payer: BC Managed Care – PPO | Source: Ambulatory Visit | Attending: Otolaryngology | Admitting: Otolaryngology

## 2013-09-03 DIAGNOSIS — J329 Chronic sinusitis, unspecified: Secondary | ICD-10-CM

## 2014-02-14 ENCOUNTER — Other Ambulatory Visit: Payer: Self-pay | Admitting: Obstetrics and Gynecology

## 2014-02-14 DIAGNOSIS — Z1231 Encounter for screening mammogram for malignant neoplasm of breast: Secondary | ICD-10-CM

## 2014-02-17 ENCOUNTER — Ambulatory Visit (HOSPITAL_COMMUNITY): Payer: BC Managed Care – PPO

## 2014-02-17 ENCOUNTER — Ambulatory Visit (HOSPITAL_COMMUNITY)
Admission: RE | Admit: 2014-02-17 | Discharge: 2014-02-17 | Disposition: A | Discharge status: Home / Self Care | Payer: BC Managed Care – PPO | Source: Ambulatory Visit | Attending: Obstetrics and Gynecology | Admitting: Obstetrics and Gynecology

## 2014-02-17 DIAGNOSIS — Z1231 Encounter for screening mammogram for malignant neoplasm of breast: Secondary | ICD-10-CM | POA: Insufficient documentation

## 2014-05-23 ENCOUNTER — Encounter: Payer: Self-pay | Admitting: Neurology

## 2015-10-25 ENCOUNTER — Other Ambulatory Visit: Payer: Self-pay | Admitting: Obstetrics and Gynecology

## 2016-01-26 ENCOUNTER — Other Ambulatory Visit: Payer: Self-pay | Admitting: Obstetrics and Gynecology

## 2016-09-25 ENCOUNTER — Other Ambulatory Visit: Payer: Self-pay | Admitting: Obstetrics and Gynecology

## 2016-09-25 DIAGNOSIS — R928 Other abnormal and inconclusive findings on diagnostic imaging of breast: Secondary | ICD-10-CM

## 2016-09-30 ENCOUNTER — Ambulatory Visit
Admission: RE | Admit: 2016-09-30 | Discharge: 2016-09-30 | Disposition: A | Discharge status: Home / Self Care | Payer: BC Managed Care – PPO | Source: Ambulatory Visit | Attending: Obstetrics and Gynecology | Admitting: Obstetrics and Gynecology

## 2016-09-30 DIAGNOSIS — R928 Other abnormal and inconclusive findings on diagnostic imaging of breast: Secondary | ICD-10-CM

## 2017-09-30 ENCOUNTER — Other Ambulatory Visit: Payer: Self-pay | Admitting: Obstetrics and Gynecology

## 2017-09-30 DIAGNOSIS — N6489 Other specified disorders of breast: Secondary | ICD-10-CM

## 2017-10-03 ENCOUNTER — Ambulatory Visit
Admission: RE | Admit: 2017-10-03 | Discharge: 2017-10-03 | Disposition: A | Discharge status: Home / Self Care | Payer: BC Managed Care – PPO | Source: Ambulatory Visit | Attending: Obstetrics and Gynecology | Admitting: Obstetrics and Gynecology

## 2017-10-03 ENCOUNTER — Other Ambulatory Visit: Payer: Self-pay | Admitting: Obstetrics and Gynecology

## 2017-10-03 DIAGNOSIS — N6489 Other specified disorders of breast: Secondary | ICD-10-CM

## 2018-01-15 ENCOUNTER — Ambulatory Visit
Admission: RE | Admit: 2018-01-15 | Discharge: 2018-01-15 | Disposition: A | Discharge status: Home / Self Care | Payer: BC Managed Care – PPO | Source: Ambulatory Visit | Attending: Physical Medicine and Rehabilitation | Admitting: Physical Medicine and Rehabilitation

## 2018-01-15 ENCOUNTER — Other Ambulatory Visit: Payer: Self-pay | Admitting: Physical Medicine and Rehabilitation

## 2018-01-15 DIAGNOSIS — M79644 Pain in right finger(s): Secondary | ICD-10-CM

## 2018-01-15 DIAGNOSIS — M25531 Pain in right wrist: Secondary | ICD-10-CM

## 2018-05-07 ENCOUNTER — Ambulatory Visit
Admission: RE | Admit: 2018-05-07 | Discharge: 2018-05-07 | Disposition: A | Discharge status: Home / Self Care | Payer: BC Managed Care – PPO | Source: Ambulatory Visit | Attending: Obstetrics and Gynecology | Admitting: Obstetrics and Gynecology

## 2018-05-07 ENCOUNTER — Other Ambulatory Visit: Payer: Self-pay | Admitting: Obstetrics and Gynecology

## 2018-05-07 DIAGNOSIS — N6489 Other specified disorders of breast: Secondary | ICD-10-CM

## 2018-11-06 ENCOUNTER — Other Ambulatory Visit: Payer: Self-pay | Admitting: Internal Medicine

## 2018-11-06 DIAGNOSIS — N6489 Other specified disorders of breast: Secondary | ICD-10-CM

## 2019-01-04 ENCOUNTER — Ambulatory Visit
Admission: RE | Admit: 2019-01-04 | Discharge: 2019-01-04 | Disposition: A | Discharge status: Home / Self Care | Payer: BC Managed Care – PPO | Source: Ambulatory Visit | Attending: Obstetrics and Gynecology | Admitting: Obstetrics and Gynecology

## 2019-01-04 ENCOUNTER — Other Ambulatory Visit: Payer: Self-pay

## 2019-01-04 DIAGNOSIS — N6489 Other specified disorders of breast: Secondary | ICD-10-CM

## 2019-09-13 ENCOUNTER — Ambulatory Visit: Payer: BC Managed Care – PPO | Attending: Family

## 2019-09-13 DIAGNOSIS — Z23 Encounter for immunization: Secondary | ICD-10-CM

## 2019-09-13 NOTE — Progress Notes (Signed)
   Covid-19 Vaccination Clinic  Name:  Margaret Pena    MRN: [id number] DOB: 04-11-1961  09/13/2019  [name] was observed post Covid-19 immunization for 15 minutes without incidence. She was provided with Vaccine Information Sheet and instruction to access the V-Safe system.   [name] was instructed to call 911 with any severe reactions post vaccine: Marland Kitchen Difficulty breathing  . Swelling of your face and throat  . A fast heartbeat  . A bad rash all over your body  . Dizziness and weakness    Immunizations Administered    Name Date Dose VIS Date Route   Moderna COVID-19 Vaccine 09/13/2019  2:20 PM 0.5 mL 06/22/2019 Intramuscular   Manufacturer: Moderna   Lot: [id number]   [id number]

## 2019-10-19 ENCOUNTER — Ambulatory Visit: Payer: BC Managed Care – PPO | Attending: Family

## 2019-10-19 DIAGNOSIS — Z23 Encounter for immunization: Secondary | ICD-10-CM

## 2019-10-19 NOTE — Progress Notes (Signed)
   Covid-19 Vaccination Clinic  Name:  Margaret Pena    MRN: [id number] DOB: 31-Jan-1961  10/19/2019  [name] was observed post Covid-19 immunization for 15 minutes without incident. She was provided with Vaccine Information Sheet and instruction to access the V-Safe system.   [name] was instructed to call 911 with any severe reactions post vaccine: Marland Kitchen Difficulty breathing  . Swelling of face and throat  . A fast heartbeat  . A bad rash all over body  . Dizziness and weakness   Immunizations Administered    Name Date Dose VIS Date Route   Moderna COVID-19 Vaccine 10/19/2019  2:01 PM 0.5 mL 06/22/2019 Intramuscular   Manufacturer: Moderna   Lot: [id number]   [id number]

## 2020-02-10 ENCOUNTER — Other Ambulatory Visit: Payer: Self-pay | Admitting: Obstetrics and Gynecology

## 2020-02-10 ENCOUNTER — Other Ambulatory Visit: Payer: Self-pay | Admitting: Internal Medicine

## 2020-02-10 DIAGNOSIS — Z1231 Encounter for screening mammogram for malignant neoplasm of breast: Secondary | ICD-10-CM

## 2020-02-11 ENCOUNTER — Other Ambulatory Visit: Payer: Self-pay | Admitting: Obstetrics and Gynecology

## 2020-02-11 DIAGNOSIS — N6489 Other specified disorders of breast: Secondary | ICD-10-CM

## 2020-02-28 ENCOUNTER — Ambulatory Visit
Admission: RE | Admit: 2020-02-28 | Discharge: 2020-02-28 | Disposition: A | Discharge status: Home / Self Care | Payer: BC Managed Care – PPO | Source: Ambulatory Visit | Attending: Obstetrics and Gynecology | Admitting: Obstetrics and Gynecology

## 2020-02-28 ENCOUNTER — Other Ambulatory Visit: Payer: Self-pay

## 2020-02-28 DIAGNOSIS — N6489 Other specified disorders of breast: Secondary | ICD-10-CM
